# Patient Record
Sex: Male | Born: 1955
Health system: Southern US, Community
[De-identification: ages and names within clinical notes are randomized; demographics above are authoritative.]

---

## 2017-06-25 DIAGNOSIS — Z7689 Persons encountering health services in other specified circumstances: Secondary | ICD-10-CM | POA: Diagnosis not present

## 2017-06-25 DIAGNOSIS — Z6829 Body mass index (BMI) 29.0-29.9, adult: Secondary | ICD-10-CM | POA: Diagnosis not present

## 2017-06-25 DIAGNOSIS — E663 Overweight: Secondary | ICD-10-CM | POA: Diagnosis not present

## 2017-06-25 DIAGNOSIS — E785 Hyperlipidemia, unspecified: Secondary | ICD-10-CM | POA: Diagnosis not present

## 2017-06-25 DIAGNOSIS — C84A0 Cutaneous T-cell lymphoma, unspecified, unspecified site: Secondary | ICD-10-CM | POA: Diagnosis not present

## 2017-06-25 DIAGNOSIS — Z85820 Personal history of malignant melanoma of skin: Secondary | ICD-10-CM | POA: Diagnosis not present

## 2017-09-28 DIAGNOSIS — L821 Other seborrheic keratosis: Secondary | ICD-10-CM | POA: Diagnosis not present

## 2017-09-28 DIAGNOSIS — D2262 Melanocytic nevi of left upper limb, including shoulder: Secondary | ICD-10-CM | POA: Diagnosis not present

## 2017-09-28 DIAGNOSIS — D2272 Melanocytic nevi of left lower limb, including hip: Secondary | ICD-10-CM | POA: Diagnosis not present

## 2017-09-28 DIAGNOSIS — L814 Other melanin hyperpigmentation: Secondary | ICD-10-CM | POA: Diagnosis not present

## 2017-09-28 DIAGNOSIS — L570 Actinic keratosis: Secondary | ICD-10-CM | POA: Diagnosis not present

## 2017-09-28 DIAGNOSIS — C8400 Mycosis fungoides, unspecified site: Secondary | ICD-10-CM | POA: Diagnosis not present

## 2017-09-28 DIAGNOSIS — D225 Melanocytic nevi of trunk: Secondary | ICD-10-CM | POA: Diagnosis not present

## 2017-09-28 DIAGNOSIS — Z85820 Personal history of malignant melanoma of skin: Secondary | ICD-10-CM | POA: Diagnosis not present

## 2017-09-28 DIAGNOSIS — D1801 Hemangioma of skin and subcutaneous tissue: Secondary | ICD-10-CM | POA: Diagnosis not present

## 2017-10-26 DIAGNOSIS — Z0000 Encounter for general adult medical examination without abnormal findings: Secondary | ICD-10-CM | POA: Diagnosis not present

## 2017-10-26 DIAGNOSIS — Z125 Encounter for screening for malignant neoplasm of prostate: Secondary | ICD-10-CM | POA: Diagnosis not present

## 2017-10-26 DIAGNOSIS — Z1211 Encounter for screening for malignant neoplasm of colon: Secondary | ICD-10-CM | POA: Diagnosis not present

## 2017-10-26 DIAGNOSIS — K439 Ventral hernia without obstruction or gangrene: Secondary | ICD-10-CM | POA: Diagnosis not present

## 2017-10-26 DIAGNOSIS — E785 Hyperlipidemia, unspecified: Secondary | ICD-10-CM | POA: Diagnosis not present

## 2017-11-25 DIAGNOSIS — N402 Nodular prostate without lower urinary tract symptoms: Secondary | ICD-10-CM | POA: Diagnosis not present

## 2017-11-25 DIAGNOSIS — R972 Elevated prostate specific antigen [PSA]: Secondary | ICD-10-CM | POA: Diagnosis not present

## 2017-12-28 MED FILL — ATORVASTATIN 40 MG TABLET: 40 | 90 days supply | Qty: 90 | Fill #0

## 2018-01-07 DIAGNOSIS — R972 Elevated prostate specific antigen [PSA]: Secondary | ICD-10-CM | POA: Diagnosis not present

## 2018-01-07 DIAGNOSIS — N402 Nodular prostate without lower urinary tract symptoms: Secondary | ICD-10-CM | POA: Diagnosis not present

## 2018-01-18 ENCOUNTER — Other Ambulatory Visit: Payer: Self-pay | Admitting: Urology

## 2018-01-18 DIAGNOSIS — C61 Malignant neoplasm of prostate: Secondary | ICD-10-CM

## 2018-04-06 MED FILL — ATORVASTATIN 40 MG TABLET: 40 | 90 days supply | Qty: 90 | Fill #0

## 2018-04-08 ENCOUNTER — Other Ambulatory Visit: Payer: Self-pay

## 2018-04-11 ENCOUNTER — Ambulatory Visit
Admission: RE | Admit: 2018-04-11 | Discharge: 2018-04-11 | Disposition: A | Discharge status: Home / Self Care | Payer: 59 | Source: Ambulatory Visit | Attending: Urology | Admitting: Urology

## 2018-04-11 DIAGNOSIS — C61 Malignant neoplasm of prostate: Secondary | ICD-10-CM

## 2018-04-11 MED ORDER — GADOBENATE DIMEGLUMINE 529 MG/ML IV SOLN
17.0000 mL | Freq: Once | INTRAVENOUS | Status: AC | PRN
  Administered 2018-04-11: 17 mL via INTRAVENOUS

## 2018-04-14 DIAGNOSIS — C61 Malignant neoplasm of prostate: Secondary | ICD-10-CM | POA: Diagnosis not present

## 2018-04-14 DIAGNOSIS — N4232 Atypical small acinar proliferation of prostate: Secondary | ICD-10-CM | POA: Diagnosis not present

## 2018-04-28 MED FILL — levoFLOXacin 750 MG TABS: 750 | 1 days supply | Qty: 1 | Fill #0

## 2018-04-29 MED FILL — DIAZEPAM 10 MG TABS: 10 | 1 days supply | Qty: 2 | Fill #0

## 2018-05-02 DIAGNOSIS — C61 Malignant neoplasm of prostate: Secondary | ICD-10-CM | POA: Diagnosis not present

## 2018-05-11 DIAGNOSIS — N402 Nodular prostate without lower urinary tract symptoms: Secondary | ICD-10-CM | POA: Diagnosis not present

## 2018-05-11 DIAGNOSIS — C61 Malignant neoplasm of prostate: Secondary | ICD-10-CM | POA: Diagnosis not present

## 2018-07-07 MED FILL — ATORVASTATIN 40 MG TABLET: 40 | 90 days supply | Qty: 90 | Fill #1

## 2018-09-29 DIAGNOSIS — Z85820 Personal history of malignant melanoma of skin: Secondary | ICD-10-CM | POA: Diagnosis not present

## 2018-09-29 DIAGNOSIS — D225 Melanocytic nevi of trunk: Secondary | ICD-10-CM | POA: Diagnosis not present

## 2018-09-29 DIAGNOSIS — D1801 Hemangioma of skin and subcutaneous tissue: Secondary | ICD-10-CM | POA: Diagnosis not present

## 2018-09-29 DIAGNOSIS — C8400 Mycosis fungoides, unspecified site: Secondary | ICD-10-CM | POA: Diagnosis not present

## 2018-09-29 DIAGNOSIS — D2261 Melanocytic nevi of right upper limb, including shoulder: Secondary | ICD-10-CM | POA: Diagnosis not present

## 2018-09-29 DIAGNOSIS — L570 Actinic keratosis: Secondary | ICD-10-CM | POA: Diagnosis not present

## 2018-09-29 DIAGNOSIS — L821 Other seborrheic keratosis: Secondary | ICD-10-CM | POA: Diagnosis not present

## 2018-09-29 DIAGNOSIS — D2262 Melanocytic nevi of left upper limb, including shoulder: Secondary | ICD-10-CM | POA: Diagnosis not present

## 2018-10-07 MED FILL — ATORVASTATIN 40 MG TABLET: 40 | 90 days supply | Qty: 90 | Fill #0

## 2018-10-25 DIAGNOSIS — C61 Malignant neoplasm of prostate: Secondary | ICD-10-CM | POA: Diagnosis not present

## 2018-11-03 DIAGNOSIS — C61 Malignant neoplasm of prostate: Secondary | ICD-10-CM | POA: Diagnosis not present

## 2018-11-22 DIAGNOSIS — R030 Elevated blood-pressure reading, without diagnosis of hypertension: Secondary | ICD-10-CM | POA: Diagnosis not present

## 2018-11-22 DIAGNOSIS — E559 Vitamin D deficiency, unspecified: Secondary | ICD-10-CM | POA: Diagnosis not present

## 2018-11-22 DIAGNOSIS — Z1211 Encounter for screening for malignant neoplasm of colon: Secondary | ICD-10-CM | POA: Diagnosis not present

## 2018-11-22 DIAGNOSIS — R7301 Impaired fasting glucose: Secondary | ICD-10-CM | POA: Diagnosis not present

## 2018-11-22 DIAGNOSIS — C61 Malignant neoplasm of prostate: Secondary | ICD-10-CM | POA: Diagnosis not present

## 2018-11-22 DIAGNOSIS — E785 Hyperlipidemia, unspecified: Secondary | ICD-10-CM | POA: Diagnosis not present

## 2018-11-22 DIAGNOSIS — Z85820 Personal history of malignant melanoma of skin: Secondary | ICD-10-CM | POA: Diagnosis not present

## 2018-11-22 DIAGNOSIS — Z0000 Encounter for general adult medical examination without abnormal findings: Secondary | ICD-10-CM | POA: Diagnosis not present

## 2018-11-22 DIAGNOSIS — E663 Overweight: Secondary | ICD-10-CM | POA: Diagnosis not present

## 2018-11-28 MED FILL — SHINGRIX 50 MCG SUS: 50 | 28 days supply | Qty: 1 | Fill #0

## 2018-12-01 DIAGNOSIS — E559 Vitamin D deficiency, unspecified: Secondary | ICD-10-CM | POA: Diagnosis not present

## 2018-12-01 DIAGNOSIS — R7301 Impaired fasting glucose: Secondary | ICD-10-CM | POA: Diagnosis not present

## 2018-12-01 DIAGNOSIS — Z0000 Encounter for general adult medical examination without abnormal findings: Secondary | ICD-10-CM | POA: Diagnosis not present

## 2018-12-01 DIAGNOSIS — E785 Hyperlipidemia, unspecified: Secondary | ICD-10-CM | POA: Diagnosis not present

## 2018-12-30 MED FILL — ATORVASTATIN 40 MG TABLET: 40 | 90 days supply | Qty: 90 | Fill #0

## 2019-02-03 DIAGNOSIS — Z23 Encounter for immunization: Secondary | ICD-10-CM | POA: Diagnosis not present

## 2019-02-10 DIAGNOSIS — Z85820 Personal history of malignant melanoma of skin: Secondary | ICD-10-CM | POA: Diagnosis not present

## 2019-02-10 DIAGNOSIS — D1801 Hemangioma of skin and subcutaneous tissue: Secondary | ICD-10-CM | POA: Diagnosis not present

## 2019-03-13 DIAGNOSIS — H524 Presbyopia: Secondary | ICD-10-CM | POA: Diagnosis not present

## 2019-03-30 MED FILL — ATORVASTATIN 40 MG TABLET: 40 | 90 days supply | Qty: 90 | Fill #1

## 2019-05-03 DIAGNOSIS — C61 Malignant neoplasm of prostate: Secondary | ICD-10-CM | POA: Diagnosis not present

## 2019-05-10 DIAGNOSIS — C61 Malignant neoplasm of prostate: Secondary | ICD-10-CM | POA: Diagnosis not present

## 2019-06-23 DIAGNOSIS — M25512 Pain in left shoulder: Secondary | ICD-10-CM | POA: Diagnosis not present

## 2019-06-23 DIAGNOSIS — M25511 Pain in right shoulder: Secondary | ICD-10-CM | POA: Diagnosis not present

## 2019-07-04 MED FILL — ATORVASTATIN 40 MG TABLET: 40 | 90 days supply | Qty: 90 | Fill #0

## 2019-07-21 DIAGNOSIS — M25512 Pain in left shoulder: Secondary | ICD-10-CM | POA: Diagnosis not present

## 2019-07-21 DIAGNOSIS — M25511 Pain in right shoulder: Secondary | ICD-10-CM | POA: Diagnosis not present

## 2019-09-30 MED FILL — ATORVASTATIN CALCIUM 40 MG: 40 | 90 days supply | Qty: 90 | Fill #1

## 2019-10-05 DIAGNOSIS — L570 Actinic keratosis: Secondary | ICD-10-CM | POA: Diagnosis not present

## 2019-10-05 DIAGNOSIS — Z85820 Personal history of malignant melanoma of skin: Secondary | ICD-10-CM | POA: Diagnosis not present

## 2019-10-05 DIAGNOSIS — C8400 Mycosis fungoides, unspecified site: Secondary | ICD-10-CM | POA: Diagnosis not present

## 2019-10-05 DIAGNOSIS — L821 Other seborrheic keratosis: Secondary | ICD-10-CM | POA: Diagnosis not present

## 2019-10-24 IMAGING — MR MR PROSTATE WO/W CM
56 series · 56 of 56 positions shown · IV contrast (Multihance 17ml)
Comparison: None.

CLINICAL DATA: Prostate cancer, Gleason score 6 at the right apex,
elevated PSA

EXAM:
MR PROSTATE WITHOUT AND WITH CONTRAST
TECHNIQUE: Multiplanar multisequence MRI images were obtained of the pelvis
centered about the prostate. Pre and post contrast images were
obtained.
CONTRAST:  17mL MULTIHANCE GADOBENATE DIMEGLUMINE 529 MG/ML IV SOLN
Creatinine was obtained on site at [HOSPITAL] at [HOSPITAL].
Results: Creatinine 0.9 mg/dL.

[Series 3: T1 · axial · 8.0mm · 1.06mm/px · 1 of 28 slices shown (1 of 2)]
[im 1/28]
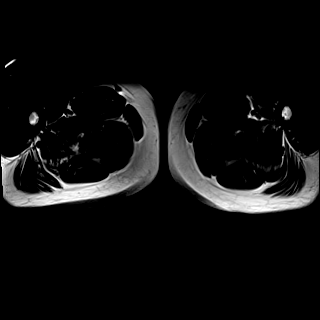

[Series 4: bSSFP fat-sat · axial · 8.0mm · 0.74mm/px · 1 of 28 slices shown]
[im 1/28]
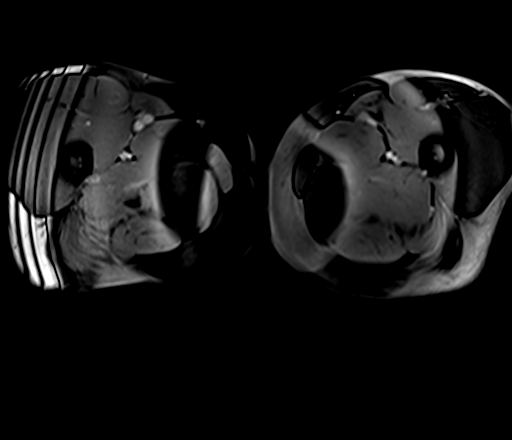

[Series 5: T2 · sagittal · 3.5mm · 0.56mm/px · 1 of 39 slices shown (1 of 4)]
[im 1/39]
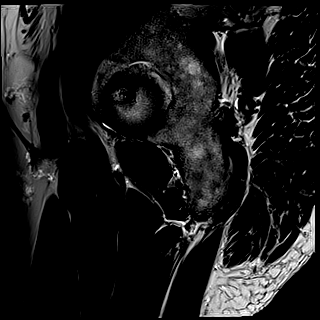

[Series 6: T1 · axial · 3.0mm · 0.31mm/px · 1 of 24 slices shown (2 of 2)]
[im 1/24]
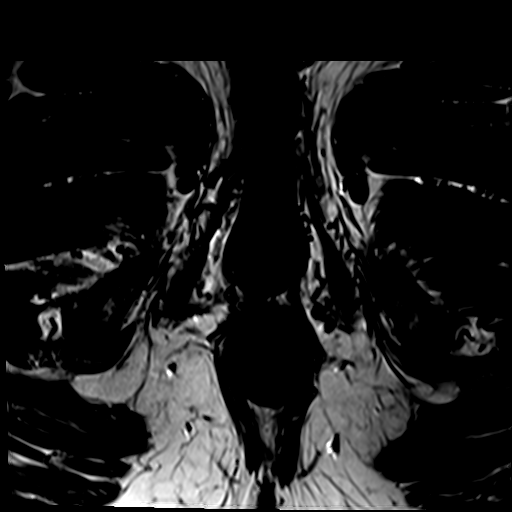

[Series 7: T2 · axial · 3.5mm · 0.56mm/px · 1 of 23 slices shown (2 of 4)]
[im 1/23]
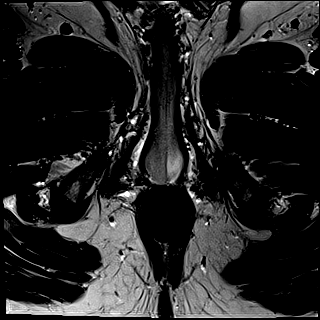

[Series 8: T2 · axial · 1.0mm · 1.04mm/px · 1 of 80 slices shown (3 of 4)]
[im 1/80]
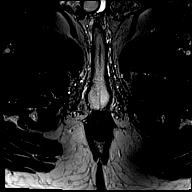

[Series 9: T2 · coronal · 3.5mm · 0.56mm/px · 1 of 23 slices shown (4 of 4)]
[im 1/23]
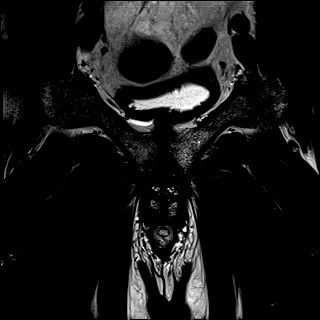

[Series 10: DWI · axial · 3.5mm · 1.56mm/px · 1 of 66 slices shown (1 of 2)]
[im 1/66]
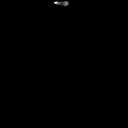

[Series 11: DWI · axial · 3.5mm · 1.56mm/px · 1 of 22 slices shown (2 of 2)]
[im 1/22]
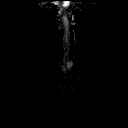

[Series 12: pre t1_twist_tra_dyn_ttc=5.3s · axial · non-contrast · 3.5mm · 0.83mm/px · 1 of 20 slices shown]
[im 1/20]
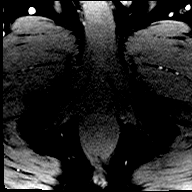

[Series 13: post t1_twist_tra_dyn-copy center · axial · 3.5mm · 0.83mm/px · 1 of 20 slices shown (1 of 24)]
[im 1/20]
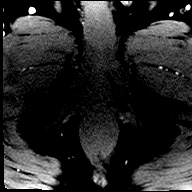

[Series 14: post t1_twist_tra_dyn-copy center · axial · 3.5mm · 0.83mm/px · 1 of 20 slices shown (2 of 24)]
[im 1/20]
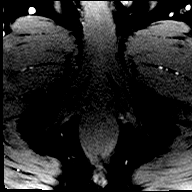

[Series 15: post t1_twist_tra_dyn-copy cent_sub_ttc=(id) · axial · 3.5mm · 0.83mm/px · 1 of 14 slices shown (1 of 22)]
[im 1/14]
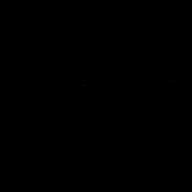

[Series 16: post t1_twist_tra_dyn-copy center · axial · 3.5mm · 0.83mm/px · 1 of 20 slices shown (3 of 24)]
[im 1/20]
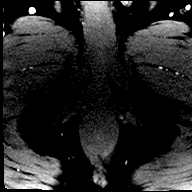

[Series 17: post t1_twist_tra_dyn-copy cent_sub_ttc=(id) · axial · 3.5mm · 0.83mm/px · 1 of 19 slices shown (2 of 22)]
[im 1/19]
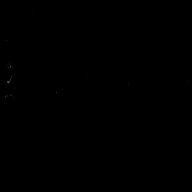

[Series 18: post t1_twist_tra_dyn-copy center · axial · 3.5mm · 0.83mm/px · 1 of 20 slices shown (4 of 24)]
[im 1/20]
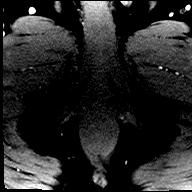

[Series 19: post t1_twist_tra_dyn-copy cent_sub_ttc=(id) · axial · 3.5mm · 0.83mm/px · 1 of 19 slices shown (3 of 22)]
[im 1/19]
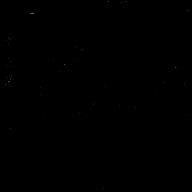

[Series 20: post t1_twist_tra_dyn-copy center · axial · 3.5mm · 0.83mm/px · 1 of 20 slices shown (5 of 24)]
[im 1/20]
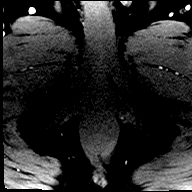

[Series 21: post t1_twist_tra_dyn-copy cent_sub_ttc=(id) · axial · 3.5mm · 0.83mm/px · 1 of 17 slices shown (4 of 22)]
[im 1/17]
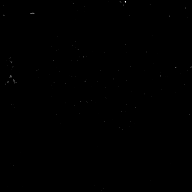

[Series 22: post t1_twist_tra_dyn-copy center · axial · 3.5mm · 0.83mm/px · 1 of 20 slices shown (6 of 24)]
[im 1/20]
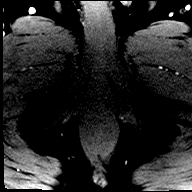

[Series 23: post t1_twist_tra_dyn-copy cent_sub_ttc=(id) · axial · 3.5mm · 0.83mm/px · 1 of 16 slices shown (5 of 22)]
[im 1/16]
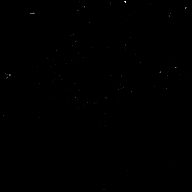

[Series 24: post t1_twist_tra_dyn-copy center · axial · 3.5mm · 0.83mm/px · 1 of 20 slices shown (7 of 24)]
[im 1/20]
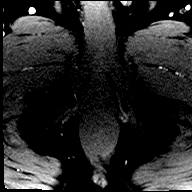

[Series 25: post t1_twist_tra_dyn-copy cent_sub_ttc=(id) · axial · 3.5mm · 0.83mm/px · 1 of 20 slices shown (6 of 22)]
[im 1/20]
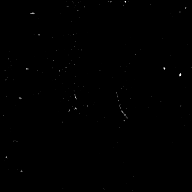

[Series 26: post t1_twist_tra_dyn-copy center · axial · 3.5mm · 0.83mm/px · 1 of 20 slices shown (8 of 24)]
[im 1/20]
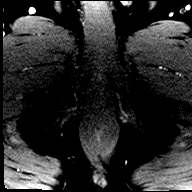

[Series 27: post t1_twist_tra_dyn-copy cent_sub_ttc=(id) · axial · 3.5mm · 0.83mm/px · 1 of 20 slices shown (7 of 22)]
[im 1/20]
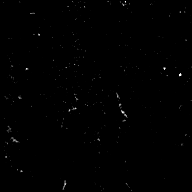

[Series 28: post t1_twist_tra_dyn-copy center · axial · 3.5mm · 0.83mm/px · 1 of 20 slices shown (9 of 24)]
[im 1/20]
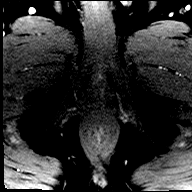

[Series 29: post t1_twist_tra_dyn-copy cent_sub_ttc=(id) · axial · 3.5mm · 0.83mm/px · 1 of 20 slices shown (8 of 22)]
[im 1/20]
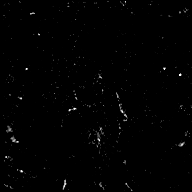

[Series 30: post t1_twist_tra_dyn-copy center · axial · 3.5mm · 0.83mm/px · 1 of 20 slices shown (10 of 24)]
[im 1/20]
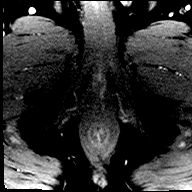

[Series 31: post t1_twist_tra_dyn-copy cent_sub_ttc=(id) · axial · 3.5mm · 0.83mm/px · 1 of 20 slices shown (9 of 22)]
[im 1/20]
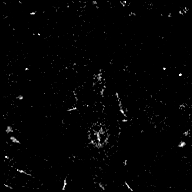

[Series 32: post t1_twist_tra_dyn-copy center · axial · 3.5mm · 0.83mm/px · 1 of 20 slices shown (11 of 24)]
[im 1/20]
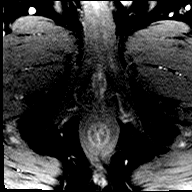

[Series 33: post t1_twist_tra_dyn-copy cent_sub_ttc=(id) · axial · 3.5mm · 0.83mm/px · 1 of 20 slices shown (10 of 22)]
[im 1/20]
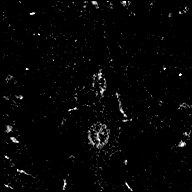

[Series 34: post t1_twist_tra_dyn-copy center · axial · 3.5mm · 0.83mm/px · 1 of 20 slices shown (12 of 24)]
[im 1/20]
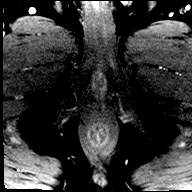

[Series 35: post t1_twist_tra_dyn-copy cent_sub_ttc=(id) · axial · 3.5mm · 0.83mm/px · 1 of 20 slices shown (11 of 22)]
[im 1/20]
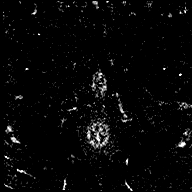

[Series 36: post t1_twist_tra_dyn-copy center · axial · 3.5mm · 0.83mm/px · 1 of 20 slices shown (13 of 24)]
[im 1/20]
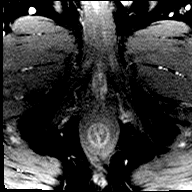

[Series 37: post t1_twist_tra_dyn-copy cent_sub_ttc=(id) · axial · 3.5mm · 0.83mm/px · 1 of 20 slices shown (12 of 22)]
[im 1/20]
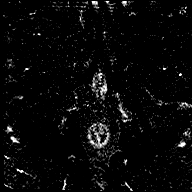

[Series 38: post t1_twist_tra_dyn-copy center · axial · 3.5mm · 0.83mm/px · 1 of 20 slices shown (14 of 24)]
[im 1/20]
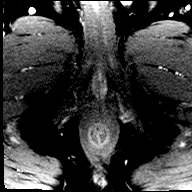

[Series 39: post t1_twist_tra_dyn-copy cent_sub_ttc=(id) · axial · 3.5mm · 0.83mm/px · 1 of 20 slices shown (13 of 22)]
[im 1/20]
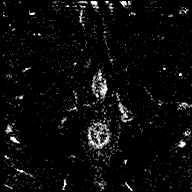

[Series 40: post t1_twist_tra_dyn-copy center · axial · 3.5mm · 0.83mm/px · 1 of 20 slices shown (15 of 24)]
[im 1/20]
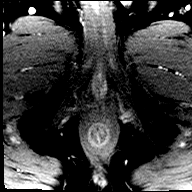

[Series 41: post t1_twist_tra_dyn-copy cent_sub_ttc=(id) · axial · 3.5mm · 0.83mm/px · 1 of 20 slices shown (14 of 22)]
[im 1/20]
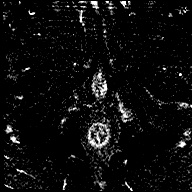

[Series 42: post t1_twist_tra_dyn-copy center · axial · 3.5mm · 0.83mm/px · 1 of 20 slices shown (16 of 24)]
[im 1/20]
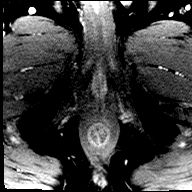

[Series 43: post t1_twist_tra_dyn-copy cent_sub_ttc=(id) · axial · 3.5mm · 0.83mm/px · 1 of 20 slices shown (15 of 22)]
[im 1/20]
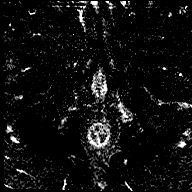

[Series 44: post t1_twist_tra_dyn-copy center · axial · 3.5mm · 0.83mm/px · 1 of 20 slices shown (17 of 24)]
[im 1/20]
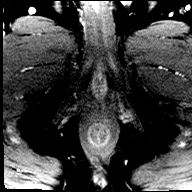

[Series 45: post t1_twist_tra_dyn-copy cent_sub_ttc=(id) · axial · 3.5mm · 0.83mm/px · 1 of 20 slices shown (16 of 22)]
[im 1/20]
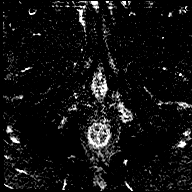

[Series 46: post t1_twist_tra_dyn-copy center · axial · 3.5mm · 0.83mm/px · 1 of 20 slices shown (18 of 24)]
[im 1/20]
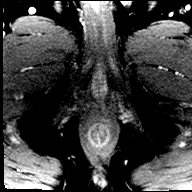

[Series 47: post t1_twist_tra_dyn-copy cent_sub_ttc=(id) · axial · 3.5mm · 0.83mm/px · 1 of 20 slices shown (17 of 22)]
[im 1/20]
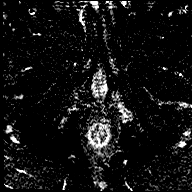

[Series 48: post t1_twist_tra_dyn-copy center · axial · 3.5mm · 0.83mm/px · 1 of 20 slices shown (19 of 24)]
[im 1/20]
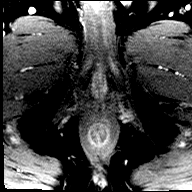

[Series 49: post t1_twist_tra_dyn-copy cent_sub_ttc=(id) · axial · 3.5mm · 0.83mm/px · 1 of 20 slices shown (18 of 22)]
[im 1/20]
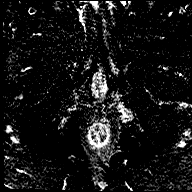

[Series 50: post t1_twist_tra_dyn-copy center · axial · 3.5mm · 0.83mm/px · 1 of 20 slices shown (20 of 24)]
[im 1/20]
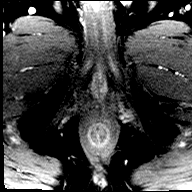

[Series 51: post t1_twist_tra_dyn-copy cent_sub_ttc=(id) · axial · 3.5mm · 0.83mm/px · 1 of 20 slices shown (19 of 22)]
[im 1/20]
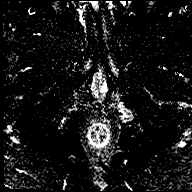

[Series 52: post t1_twist_tra_dyn-copy center · axial · 3.5mm · 0.83mm/px · 1 of 20 slices shown (21 of 24)]
[im 1/20]
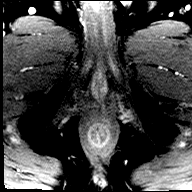

[Series 53: post t1_twist_tra_dyn-copy cent_sub_ttc=(id) · axial · 3.5mm · 0.83mm/px · 1 of 20 slices shown (20 of 22)]
[im 1/20]
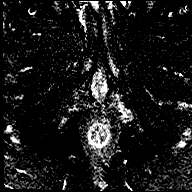

[Series 54: post t1_twist_tra_dyn-copy center · axial · 3.5mm · 0.83mm/px · 1 of 20 slices shown (22 of 24)]
[im 1/20]
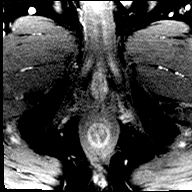

[Series 55: post t1_twist_tra_dyn-copy cent_sub_ttc=(id) · axial · 3.5mm · 0.83mm/px · 1 of 20 slices shown (21 of 22)]
[im 1/20]
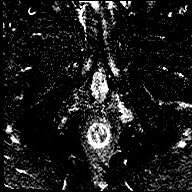

[Series 56: post t1_twist_tra_dyn-copy center · axial · 3.5mm · 0.83mm/px · 1 of 20 slices shown (23 of 24)]
[im 1/20]
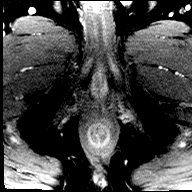

[Series 57: post t1_twist_tra_dyn-copy cent_sub_ttc=(id) · axial · 3.5mm · 0.83mm/px · 1 of 20 slices shown (22 of 22)]
[im 1/20]
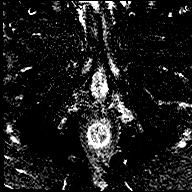

[Series 58: post t1_twist_tra_dyn-copy center · axial · 3.5mm · 0.83mm/px · 1 of 20 slices shown (24 of 24)]
[im 1/20]
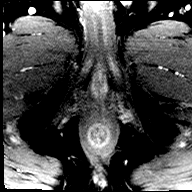

[56 of 56 positions shown; findings below may reference images not displayed]

FINDINGS: Prostate: 11 x 6 x 11 mm focal low T2 lesion in the right
posterolateral apex (series 7/image 13). Very mild restricted
diffusion/low ADC (series 11/image 14) at the extreme apex.
Associated early arterial enhancement. This appearance is compatible
with the patient's known macroscopic prostate cancer and may reflect
small volume high-grade tumor. PI-RADS 4.

7 x 6 x 5 mm focal low T2 lesion at the extreme medial left apex
(series 7/image 15). No restricted diffusion/low ADC. Associated
early arterial enhancement. This appearance raises concern for at
least low grade macroscopic prostate cancer.

Mild nodularity central gland, without findings suspicious for
macroscopic prostate cancer on T2.

Volume: 4.5 x 3.7 x 4.1 cm (32.0 mL)

Transcapsular spread:  Absent.

Seminal vesicle involvement: Absent.

Neurovascular bundle involvement: Absent.

Pelvic adenopathy: Absent.

Bone metastasis: Absent.

Other findings: None.
IMPRESSION: 11 mm lesion in the right posterolateral apex, compatible with the
patient's known macroscopic prostate cancer, possibly with small
volume high-grade tumor. PI-RADS 4.

Additional 7 mm lesion in the extreme medial left apex, raising
concern for contralateral low-grade macroscopic prostate cancer.

No findings suspicious for extracapsular extension, seminal vesicle
invasion, or metastatic disease.

## 2019-10-31 DIAGNOSIS — C61 Malignant neoplasm of prostate: Secondary | ICD-10-CM | POA: Diagnosis not present

## 2019-11-08 DIAGNOSIS — C61 Malignant neoplasm of prostate: Secondary | ICD-10-CM | POA: Diagnosis not present

## 2019-11-16 DIAGNOSIS — Z85820 Personal history of malignant melanoma of skin: Secondary | ICD-10-CM | POA: Diagnosis not present

## 2019-11-16 DIAGNOSIS — C8400 Mycosis fungoides, unspecified site: Secondary | ICD-10-CM | POA: Diagnosis not present

## 2019-11-16 DIAGNOSIS — D225 Melanocytic nevi of trunk: Secondary | ICD-10-CM | POA: Diagnosis not present

## 2019-11-16 MED FILL — TRIAMCINOLONE ACETONIDE 0.1: 0.1 | 20 days supply | Qty: 80 | Fill #0

## 2019-11-24 DIAGNOSIS — R001 Bradycardia, unspecified: Secondary | ICD-10-CM | POA: Diagnosis not present

## 2019-11-24 DIAGNOSIS — Z0000 Encounter for general adult medical examination without abnormal findings: Secondary | ICD-10-CM | POA: Diagnosis not present

## 2019-11-24 DIAGNOSIS — R002 Palpitations: Secondary | ICD-10-CM | POA: Diagnosis not present

## 2019-11-24 DIAGNOSIS — Z1211 Encounter for screening for malignant neoplasm of colon: Secondary | ICD-10-CM | POA: Diagnosis not present

## 2019-11-24 DIAGNOSIS — E785 Hyperlipidemia, unspecified: Secondary | ICD-10-CM | POA: Diagnosis not present

## 2019-12-02 MED FILL — TRIAMCINOLONE ACETONIDE 0.1: 0.1 | 20 days supply | Qty: 80 | Fill #1

## 2019-12-05 DIAGNOSIS — R7301 Impaired fasting glucose: Secondary | ICD-10-CM | POA: Diagnosis not present

## 2019-12-25 MED FILL — ATORVASTATIN CALCIUM 40 MG: 40 | 90 days supply | Qty: 90 | Fill #0

## 2020-01-16 ENCOUNTER — Encounter: Payer: Self-pay | Admitting: *Deleted

## 2020-01-16 ENCOUNTER — Ambulatory Visit (INDEPENDENT_AMBULATORY_CARE_PROVIDER_SITE_OTHER): Payer: 59 | Admitting: Cardiology

## 2020-01-16 ENCOUNTER — Encounter: Payer: Self-pay | Admitting: Cardiology

## 2020-01-16 ENCOUNTER — Other Ambulatory Visit: Payer: Self-pay

## 2020-01-16 VITALS — BP 130/60 | HR 37 | Temp 93.9°F | Ht 67.0 in | Wt 177.8 lb

## 2020-01-16 DIAGNOSIS — R001 Bradycardia, unspecified: Secondary | ICD-10-CM | POA: Diagnosis not present

## 2020-01-16 DIAGNOSIS — Z7189 Other specified counseling: Secondary | ICD-10-CM | POA: Diagnosis not present

## 2020-01-16 DIAGNOSIS — R002 Palpitations: Secondary | ICD-10-CM

## 2020-01-16 NOTE — Patient Instructions (Signed)
Medication Instructions:  Your Physician recommend you continue on your current medication as directed.    *If you need a refill on your cardiac medications before your next appointment, please call your pharmacy*   Lab Work: None ordered    Testing/Procedures: Our physician has recommended that you wear an 14  DAY ZIO-PATCH monitor. The Zio patch cardiac monitor continuously records heart rhythm data for up to 14 days, this is for patients being evaluated for multiple types heart rhythms. For the first 24 hours post application, please avoid getting the Zio monitor wet in the shower or by excessive sweating during exercise. After that, feel free to carry on with regular activities. Keep soaps and lotions away from the ZIO XT Patch.   Someone from our office will call to verify address and mail monitor.     Follow-Up: At [facility], you and your health needs are our priority.  As part of our continuing mission to provide you with exceptional heart care, we have created designated Provider Care Teams.  These Care Teams include your primary Cardiologist (physician) and Advanced Practice Providers (APPs -  Physician Assistants and Nurse Practitioners) who all work together to provide you with the care you need, when you need it.  We recommend signing up for the patient portal called "MyChart".  Sign up information is provided on this After Visit Summary.  MyChart is used to connect with patients for Virtual Visits (Telemedicine).  Patients are able to view lab/test results, encounter notes, upcoming appointments, etc.  Non-urgent messages can be sent to your provider as well.   To learn more about what you can do with MyChart, go to [email or web address].    Your next appointment:   As needed  The format for your next appointment:   In Person  Provider:   Bridgette Christopher, MD  ZIO XT- Long Term Monitor Instructions   Your physician has requested you wear your ZIO patch  monitor_14______days.   This is a single patch monitor.  [employer] supplies one patch monitor per enrollment.  Additional stickers are not available.   Please do not apply patch if you will be having a Nuclear Stress Test, Echocardiogram, Cardiac CT, MRI, or Chest Xray during the time frame you would be wearing the monitor. The patch cannot be worn during these tests.  You cannot remove and re-apply the ZIO XT patch monitor.   Your ZIO patch monitor will be sent USPS Priority mail from [employer] directly to your home address. The monitor may also be mailed to a PO BOX if home delivery is not available.   It may take 3-5 days to receive your monitor after you have been enrolled.   Once you have received you monitor, please review enclosed instructions.  Your monitor has already been registered assigning a specific monitor serial # to you.   Applying the monitor   Shave hair from upper left chest.   Hold abrader disc by orange tab.  Rub abrader in 40 strokes over left upper chest as indicated in your monitor instructions.   Clean area with 4 enclosed alcohol pads .  Use all pads to assure are is cleaned thoroughly.  Let dry.   Apply patch as indicated in monitor instructions.  Patch will be place under collarbone on left side of chest with arrow pointing upward.   Rub patch adhesive wings for 2 minutes.Remove white label marked "1".  Remove white label marked "2".  Rub patch adhesive wings for 2 additional   While looking in a mirror, press and release button in center of patch.  A small green light will flash 3-4 times .  This will be your only indicator the monitor has been turned on.     Do not shower for the first 24 hours.  You may shower after the first 24 hours.   Press button if you feel a symptom. You will hear a small click.  Record Date, Time and Symptom in the Patient Log Book.   When you are ready to remove patch, follow instructions on last 2 pages of Patient  Log Book.  Stick patch monitor onto last page of Patient Log Book.   Place Patient Log Book in Blue box.  Use locking tab on box and tape box closed securely.  The Orange and White box has prepaid postage on it.  Please place in mailbox as soon as possible.  Your physician should have your test results approximately 7 days after the monitor has been mailed back to [employer].   Call [employer] Customer Care at [phone] if you have questions regarding your ZIO XT patch monitor.  Call them immediately if you see an orange light blinking on your monitor.   If your monitor falls off in less than 4 days contact our Monitor department at [phone].  If your monitor becomes loose or falls off after 4 days call [employer] at [phone] for suggestions on securing your monitor.     

## 2020-01-16 NOTE — Progress Notes (Signed)
Patient ID: Craig Mcdonald, male   DOB: 04-22-1956, 64 y.o.   MRN: [id number] Patient enrolled for [employer] to ship a 14 day ZIO XT long term holter monitor to his home.

## 2020-01-16 NOTE — Progress Notes (Signed)
Cardiology Office Note:    Date:  01/16/2020   ID:  DARCY CORDNER, DOB [birth year], MRN [id number]  PCP:  Caren Macadam, MD  Cardiologist:  Buford Dresser, MD  Referring MD: Caren Macadam, MD   CC: new patient consultation for palpitations  History of Present Illness:    Craig Mcdonald is a 64 y.o. male with a hx of hyperlipidemia, prostate cancer who is seen as a new consult at the request of Caren Macadam, MD for the evaluation and management of palpitations.  Tachycardia/palpitations: -Initial onset: at least several years, escalated in last two years or so -Frequency/Duration: recently has been 2-3 times/week. Last about 5-10 seconds -Associated symptoms: feels like it sucks the air out of him, needs to cough. No nausea, chest pain, sweating  -Aggravating/alleviating factors: deep breath makes it better. Not exertional, always notices while relaxing. Does not wake him from sleep -Syncope/near syncope: none -Prior cardiac history: told he might have a murmur as a child, but couldn't confirm it. Never came up again. -Prior workup: Lived in [place], had a free screening in the heart hospital and was told everything was fine. -Prior treatment: none -Possible medication interactions: none -Caffeine: several cups of 50/50 caffeine/decaf every day -Alcohol: several beers/day and wine on the weekends -Tobacco: never -OTC supplements: only B12, no energy supplements -Comorbidities: prostate cancer, has had biopsies but watching for now, no treatment history -Exercise level: rides recumbent biks for 30 minutes in the morning. Walks in the evenings/weekends -Labs: TSH, kidney function/electrolytes, CBC reviewed. -Cardiac ROS: no chest pain, no shortness of breath, no PND, no orthopnea, no LE edema. -Family history: mother has had several stents in her 68s. Brother has diastolic heart failure. No stroke history.  History reviewed. No pertinent past medical history.  History  reviewed. No pertinent surgical history.  Current Medications: No current outpatient medications on file prior to visit.   No current facility-administered medications on file prior to visit.     Allergies:   Patient has no allergy information on record.   Social History   Tobacco Use  . Smoking status: Not on file  Substance Use Topics  . Alcohol use: Not on file  . Drug use: Not on file    Family History: mother has had several stents in her 78s. Brother has diastolic heart failure. No stroke history.  ROS:   Please see the history of present illness.  Additional pertinent ROS: Constitutional: Negative for chills, fever, night sweats, unintentional weight loss  HENT: Negative for ear pain and hearing loss.   Eyes: Negative for loss of vision and eye pain.  Respiratory: Negative for cough, sputum, wheezing.   Cardiovascular: See HPI. Gastrointestinal: Negative for abdominal pain, melena, and hematochezia.  Genitourinary: Negative for dysuria and hematuria.  Musculoskeletal: Negative for falls and myalgias.  Skin: Negative for itching and rash.  Neurological: Negative for focal weakness, focal sensory changes and loss of consciousness.  Endo/Heme/Allergies: Does not bruise/bleed easily.     EKGs/Labs/Other Studies Reviewed:    The following studies were reviewed today: ADDENDED to add results of monitor: 12 days of data recorded on Zio monitor. Patient had a min HR of 31 bpm, max HR of 164 bpm, and avg HR of 52 bpm. Predominant underlying rhythm was Sinus Rhythm. No VT, atrial fibrillation, high degree block, or pauses noted. Isolated atrial and ventricular ectopy was rare (<1%). 6 brief episodes of SVT noted, longest lasting 6 beats. There were 4 triggered events. Three of these were  sinus without ectopy, and one was sinus with PAC.   EKG:  EKG is personally reviewed.  The ekg ordered today demonstrates sinus bradycardia at 37 bpm  Recent Labs: No results found for  requested labs within last 8760 hours.  Recent Lipid Panel No results found for: CHOL, TRIG, HDL, CHOLHDL, VLDL, LDLCALC, LDLDIRECT  Physical Exam:    VS:  BP 130/60   Pulse (!) 37   Temp (!) 93.9 F (34.4 C)   Ht 5\' 7"  (1.702 m)   Wt 177 lb 12.8 oz (80.6 kg)   SpO2 96%   BMI 27.85 kg/m     Wt Readings from Last 3 Encounters:  No data found for Wt    GEN: Well nourished, well developed in no acute distress HEENT: Normal, moist mucous membranes NECK: No JVD CARDIAC: regular rhythm, normal S1 and S2, no rubs or gallops. No murmurs. VASCULAR: Radial and DP pulses 2+ bilaterally. No carotid bruits RESPIRATORY:  Clear to auscultation without rales, wheezing or rhonchi  ABDOMEN: Soft, non-tender, non-distended MUSCULOSKELETAL:  Ambulates independently SKIN: Warm and dry, no edema NEUROLOGIC:  Alert and oriented x 3. No focal neuro deficits noted. PSYCHIATRIC:  Normal affect    ASSESSMENT:    1. Heart palpitations   2. Sinus bradycardia   3. Cardiac risk counseling   4. Counseling on health promotion and disease prevention    PLAN:    Palpitations: -we discussed the potential causes of fast heart rates and palpitations today. Reviewed the normal electrical system of the heart. Reviewed the role of the sinus node. Reviewed the balance between resting (vagal) tone and fight or flight nervous system input. Reviewed how exercise improves vagal tone and lowers resting heart rate. Reviewed that sinus tachycardia, or elevated sinus rate, is usually secondary to something else in the body. This can include pain, stress, infection, anxiety, hormone imbalance, low blood counts, etc. Discussed that we do not typically treat sinus tachycardia by itself, and instead the focus is on finding what is driving the heart rate and treating that. We discussed that there can be other rhythm issues, from either the top or bottom of the heart, that are abnormal rhythms. Discussed how we evaluate for  these. -will order 14 day Zio for further evaluation  Sinus bradycardia: -monitor will show any pauses and assess heart rate range  Cardiac risk counseling and prevention recommendations: -recommend heart healthy/Mediterranean diet, with whole grains, fruits, vegetable, fish, lean meats, nuts, and olive oil. Limit salt. -recommend moderate walking, 3-5 times/week for 30-50 minutes each session. Aim for at least 150 minutes.week. Goal should be pace of 3 miles/hours, or walking 1.5 miles in 30 minutes -recommend avoidance of tobacco products. Avoid excess alcohol. -ASCVD risk score: The ASCVD Risk score Mikey Bussing DC Jr., et al., 2013) failed to calculate for the following reasons:   Cannot find a previous HDL lab   Cannot find a previous total cholesterol lab    Plan for follow up: if monitor unremarkable, can follow up as needed  Buford Dresser, MD, PhD [place]  [facility]    Medication Adjustments/Labs and Tests Ordered: Current medicines are reviewed at length with the patient today.  Concerns regarding medicines are outlined above.  Orders Placed This Encounter  Procedures  . LONG TERM MONITOR (3-14 DAYS)  . EKG 12-Lead   No orders of the defined types were placed in this encounter.   Patient Instructions  Medication Instructions:  Your Physician recommend you continue on your current  medication as directed.    *If you need a refill on your cardiac medications before your next appointment, please call your pharmacy*   Lab Work: None ordered   Testing/Procedures: Our physician has recommended that you wear an Nolan monitor. The Zio patch cardiac monitor continuously records heart rhythm data for up to 14 days, this is for patients being evaluated for multiple types heart rhythms. For the first 24 hours post application, please avoid getting the Zio monitor wet in the shower or by excessive sweating during exercise. After that, feel free to carry on  with regular activities. Keep soaps and lotions away from the ZIO XT Patch.   Someone from our office will call to verify address and mail monitor.     Follow-Up: At [facility], you and your health needs are our priority.  As part of our continuing mission to provide you with exceptional heart care, we have created designated Provider Care Teams.  These Care Teams include your primary Cardiologist (physician) and Advanced Practice Providers (APPs -  Physician Assistants and Nurse Practitioners) who all work together to provide you with the care you need, when you need it.  We recommend signing up for the patient portal called "MyChart".  Sign up information is provided on this After Visit Summary.  MyChart is used to connect with patients for Virtual Visits (Telemedicine).  Patients are able to view lab/test results, encounter notes, upcoming appointments, etc.  Non-urgent messages can be sent to your provider as well.   To learn more about what you can do with MyChart, go to [email or web address].    Your next appointment:   As needed  The format for your next appointment:   In Person  Provider:   Buford Dresser, MD  [place] Instructions   Your physician has requested you wear your ZIO patch monitor___14____days.   This is a single patch monitor.  [employer] supplies one patch monitor per enrollment.  Additional stickers are not available.   Please do not apply patch if you will be having a Nuclear Stress Test, Echocardiogram, Cardiac CT, MRI, or Chest Xray during the time frame you would be wearing the monitor. The patch cannot be worn during these tests.  You cannot remove and re-apply the ZIO XT patch monitor.   Your ZIO patch monitor will be sent USPS Priority mail from [facility] directly to your home address. The monitor may also be mailed to a PO BOX if home delivery is not available.   It may take 3-5 days to receive your monitor after you  have been enrolled.   Once you have received you monitor, please review enclosed instructions.  Your monitor has already been registered assigning a specific monitor serial # to you.   Applying the monitor   Shave hair from upper left chest.   Hold abrader disc by orange tab.  Rub abrader in 40 strokes over left upper chest as indicated in your monitor instructions.   Clean area with 4 enclosed alcohol pads .  Use all pads to assure are is cleaned thoroughly.  Let dry.   Apply patch as indicated in monitor instructions.  Patch will be place under collarbone on left side of chest with arrow pointing upward.   Rub patch adhesive wings for 2 minutes.Remove white label marked "1".  Remove white label marked "2".  Rub patch adhesive wings for 2 additional minutes.   While looking in a mirror, press and release  button in center of patch.  A small green light will flash 3-4 times .  This will be your only indicator the monitor has been turned on.     Do not shower for the first 24 hours.  You may shower after the first 24 hours.   Press button if you feel a symptom. You will hear a small click.  Record Date, Time and Symptom in the Patient Log Book.   When you are ready to remove patch, follow instructions on last 2 pages of Patient Log Book.  Stick patch monitor onto last page of Patient Log Book.   Place Patient Log Book in [place] box.  Use locking tab on box and tape box closed securely.  The [employer] and [employer] has [employer] on it.  Please place in mailbox as soon as possible.  Your physician should have your test results approximately 7 days after the monitor has been mailed back to [facility].   Call [place] at [phone] if you have questions regarding your ZIO XT patch monitor.  Call them immediately if you see an orange light blinking on your monitor.   If your monitor falls off in less than 4 days contact our Monitor department at [phone].  If your  monitor becomes loose or falls off after 4 days call [employer] at [phone] for suggestions on securing your monitor.       Signed, Buford Dresser, MD PhD 01/16/2020    [place]

## 2020-01-19 ENCOUNTER — Ambulatory Visit (INDEPENDENT_AMBULATORY_CARE_PROVIDER_SITE_OTHER): Payer: 59

## 2020-01-19 DIAGNOSIS — R002 Palpitations: Secondary | ICD-10-CM

## 2020-02-08 DIAGNOSIS — R002 Palpitations: Secondary | ICD-10-CM | POA: Diagnosis not present

## 2020-03-01 ENCOUNTER — Telehealth: Payer: Self-pay | Admitting: Cardiology

## 2020-03-01 NOTE — Telephone Encounter (Signed)
Spoke with patient. Currently awaiting interpretation of results. Once Dr. Harrell Gave has read and released the results a nurse or Dr. Harrell Gave will call to discuss. Patient verbalized understanding, no further questions at this time.

## 2020-03-01 NOTE — Telephone Encounter (Signed)
Patient calling for heart monitor results.  

## 2020-03-06 ENCOUNTER — Other Ambulatory Visit (HOSPITAL_COMMUNITY): Payer: Self-pay

## 2020-03-06 DIAGNOSIS — E785 Hyperlipidemia, unspecified: Secondary | ICD-10-CM | POA: Diagnosis not present

## 2020-03-06 DIAGNOSIS — Z1211 Encounter for screening for malignant neoplasm of colon: Secondary | ICD-10-CM | POA: Diagnosis not present

## 2020-03-06 DIAGNOSIS — Z85820 Personal history of malignant melanoma of skin: Secondary | ICD-10-CM | POA: Diagnosis not present

## 2020-03-06 DIAGNOSIS — C61 Malignant neoplasm of prostate: Secondary | ICD-10-CM | POA: Diagnosis not present

## 2020-03-13 MED FILL — ATORVASTATIN 40 MG TABLET: 40 | 90 days supply | Qty: 90 | Fill #0

## 2020-03-27 DIAGNOSIS — K4030 Unilateral inguinal hernia, with obstruction, without gangrene, not specified as recurrent: Secondary | ICD-10-CM | POA: Diagnosis not present

## 2020-03-27 DIAGNOSIS — C61 Malignant neoplasm of prostate: Secondary | ICD-10-CM | POA: Diagnosis not present

## 2020-04-08 DIAGNOSIS — D485 Neoplasm of uncertain behavior of skin: Secondary | ICD-10-CM | POA: Diagnosis not present

## 2020-04-08 DIAGNOSIS — L814 Other melanin hyperpigmentation: Secondary | ICD-10-CM | POA: Diagnosis not present

## 2020-04-08 DIAGNOSIS — L538 Other specified erythematous conditions: Secondary | ICD-10-CM | POA: Diagnosis not present

## 2020-04-08 DIAGNOSIS — D225 Melanocytic nevi of trunk: Secondary | ICD-10-CM | POA: Diagnosis not present

## 2020-04-08 DIAGNOSIS — L298 Other pruritus: Secondary | ICD-10-CM | POA: Diagnosis not present

## 2020-04-08 DIAGNOSIS — L570 Actinic keratosis: Secondary | ICD-10-CM | POA: Diagnosis not present

## 2020-04-08 DIAGNOSIS — L578 Other skin changes due to chronic exposure to nonionizing radiation: Secondary | ICD-10-CM | POA: Diagnosis not present

## 2020-04-08 DIAGNOSIS — Z85820 Personal history of malignant melanoma of skin: Secondary | ICD-10-CM | POA: Diagnosis not present

## 2020-04-08 DIAGNOSIS — L821 Other seborrheic keratosis: Secondary | ICD-10-CM | POA: Diagnosis not present

## 2020-04-08 DIAGNOSIS — Z08 Encounter for follow-up examination after completed treatment for malignant neoplasm: Secondary | ICD-10-CM | POA: Diagnosis not present

## 2020-04-23 DIAGNOSIS — C61 Malignant neoplasm of prostate: Secondary | ICD-10-CM | POA: Diagnosis not present

## 2020-05-02 DIAGNOSIS — C61 Malignant neoplasm of prostate: Secondary | ICD-10-CM | POA: Diagnosis not present

## 2020-05-07 DIAGNOSIS — C61 Malignant neoplasm of prostate: Secondary | ICD-10-CM | POA: Diagnosis not present

## 2020-07-05 MED FILL — ATORVASTATIN 40 MG TABLET: 40 | 90 days supply | Qty: 90 | Fill #1

## 2020-07-18 ENCOUNTER — Other Ambulatory Visit (HOSPITAL_BASED_OUTPATIENT_CLINIC_OR_DEPARTMENT_OTHER): Payer: Self-pay

## 2020-10-03 ENCOUNTER — Other Ambulatory Visit (HOSPITAL_COMMUNITY): Payer: Self-pay

## 2020-10-03 MED ORDER — ATORVASTATIN CALCIUM 40 MG PO TABS
ORAL_TABLET | ORAL | 3 refills | Status: DC
  Filled 2020-10-03: qty 31, 31d supply, fill #0

## 2020-10-04 ENCOUNTER — Other Ambulatory Visit (HOSPITAL_COMMUNITY): Payer: Self-pay
